# Patient Record
Sex: Male | Born: 1990 | Race: White | Hispanic: No | Marital: Single | Smoking: Never smoker
Health system: Southern US, Community
[De-identification: ages and names within clinical notes are randomized; demographics above are authoritative.]

---

## 2013-01-13 ENCOUNTER — Encounter (HOSPITAL_COMMUNITY): Payer: Self-pay | Admitting: Emergency Medicine

## 2013-01-13 ENCOUNTER — Emergency Department (HOSPITAL_COMMUNITY)
Admission: EM | Admit: 2013-01-13 | Discharge: 2013-01-13 | Disposition: A | Discharge status: Home / Self Care | Payer: BC Managed Care – PPO | Attending: Emergency Medicine | Admitting: Emergency Medicine

## 2013-01-13 ENCOUNTER — Emergency Department (HOSPITAL_COMMUNITY): Payer: BC Managed Care – PPO

## 2013-01-13 DIAGNOSIS — Y9239 Other specified sports and athletic area as the place of occurrence of the external cause: Secondary | ICD-10-CM | POA: Insufficient documentation

## 2013-01-13 DIAGNOSIS — Y9367 Activity, basketball: Secondary | ICD-10-CM | POA: Insufficient documentation

## 2013-01-13 DIAGNOSIS — S8992XA Unspecified injury of left lower leg, initial encounter: Secondary | ICD-10-CM

## 2013-01-13 DIAGNOSIS — W1801XA Striking against sports equipment with subsequent fall, initial encounter: Secondary | ICD-10-CM | POA: Insufficient documentation

## 2013-01-13 DIAGNOSIS — S8990XA Unspecified injury of unspecified lower leg, initial encounter: Secondary | ICD-10-CM | POA: Insufficient documentation

## 2013-01-13 MED ORDER — TRAMADOL HCL 50 MG PO TABS: 50.0000 mg | ORAL_TABLET | Freq: Four times a day (QID) | ORAL | Status: AC | PRN

## 2013-01-13 NOTE — ED Provider Notes (Signed)
Medical screening examination/treatment/procedure(s) were performed by non-physician practitioner and as supervising physician I was immediately available for consultation/collaboration.   Shelda Jakes, MD 01/13/13 [phone]

## 2013-01-13 NOTE — Progress Notes (Signed)
Orthopedic Tech Progress Note Patient Details:  Bruce Schmidt 08-10-1990 [id number]  Ortho Devices Type of Ortho Device: Crutches;Knee Immobilizer Ortho Device/Splint Interventions: Ordered;Application   Jennye Moccasin 01/13/2013, 6:37 PM

## 2013-01-13 NOTE — ED Provider Notes (Signed)
CSN: [id number]     Arrival date & time 01/13/13  1618 History   First MD Initiated Contact with Patient 01/13/13 1629     Chief Complaint  Patient presents with  . Knee Injury   (Consider location/radiation/quality/duration/timing/severity/associated sxs/prior Treatment) HPI Comments: Patient presents today with a chief complaint of left knee pain.  He reports that while playing basketball just prior to arrival he jumped up and when he came down he planted on his left leg.  His left knee then gave out and he fell to the ground.  He immediately began having pain of the left knee.  Pain has been constant since that time, but pain did improve with applying ice.  He reports that he has not attempted to bear weight since the injury.  He denies numbness or tingling.  He reports that initially he noticed mild swelling of the knee, but this has improved at this time.  He denies any prior injury to the knee.    The history is provided by the patient.    History reviewed. No pertinent past medical history. History reviewed. No pertinent past surgical history. No family history on file. History  Substance Use Topics  . Smoking status: Never Smoker   . Smokeless tobacco: Not on file  . Alcohol Use: No    Review of Systems  Musculoskeletal:       Left knee pain  All other systems reviewed and are negative.    Allergies  Review of patient's allergies indicates no known allergies.  Home Medications  No current outpatient prescriptions on file. BP 119/63  Pulse 91  Temp(Src) 98.1 F (36.7 C) (Oral)  Resp 18  Ht 5\' 9"  (1.753 m)  Wt 178 lb (80.74 kg)  BMI 26.27 kg/m2  SpO2 98% Physical Exam  Nursing note and vitals reviewed. Constitutional: He appears well-developed and well-nourished.  HENT:  Head: Normocephalic and atraumatic.  Cardiovascular: Normal rate, regular rhythm and normal heart sounds.   Pulses:      Dorsalis pedis pulses are 2+ on the right side, and 2+ on the left  side.  Pulmonary/Chest: Effort normal and breath sounds normal.  Musculoskeletal:       Left knee: He exhibits bony tenderness. He exhibits no swelling, no effusion, no deformity, no erythema, no LCL laxity and no MCL laxity. Tenderness found. Lateral joint line tenderness noted.  Pain with ROM of the left knee Negative anterior and posterior drawer.  Neurological: He is alert. No sensory deficit.  Skin: Skin is warm and dry.  Psychiatric: He has a normal mood and affect.    ED Course  Procedures (including critical care time) Labs Review Labs Reviewed - No data to display Imaging Review Dg Knee Complete 4 Views Left  01/13/2013   CLINICAL DATA:  Basketball injury. Left knee pain.  EXAM: LEFT KNEE - COMPLETE 4+ VIEW  COMPARISON:  None.  FINDINGS: There is no evidence of fracture, dislocation, or joint effusion. There is no evidence of arthropathy or other focal bone abnormality. Soft tissues are unremarkable.  IMPRESSION: Negative.   Electronically Signed   By: Drusilla Kanner M.D.   On: 01/13/2013 17:47    EKG Interpretation   None       MDM  No diagnosis found. Patient presenting with left knee pain after injuring his knee while playing basketball just prior to arrival.  Xray negative.  Patient neurovascularly intact.  Patient given knee immobilizer and crutches.  Patient given referral to Orthopedics.  Pascal Lux Alba, PA-C 01/13/13 [phone]

## 2013-01-13 NOTE — ED Notes (Signed)
Pt came down from jump while playing b-ball and felt excruciating pain to L knee when he landed on L leg.  Pain decreases with ice.  No deformity noted at this time.  Pain is lateral.  Denies numbness or tingling to foot.

## 2014-03-01 IMAGING — CR DG KNEE COMPLETE 4+V*L*
4 series · 4 of 4 positions shown · non-contrast
Comparison: None.

CLINICAL DATA: Basketball injury. Left knee pain.

EXAM:
LEFT KNEE - COMPLETE 4+ VIEW

[t knee ap left]
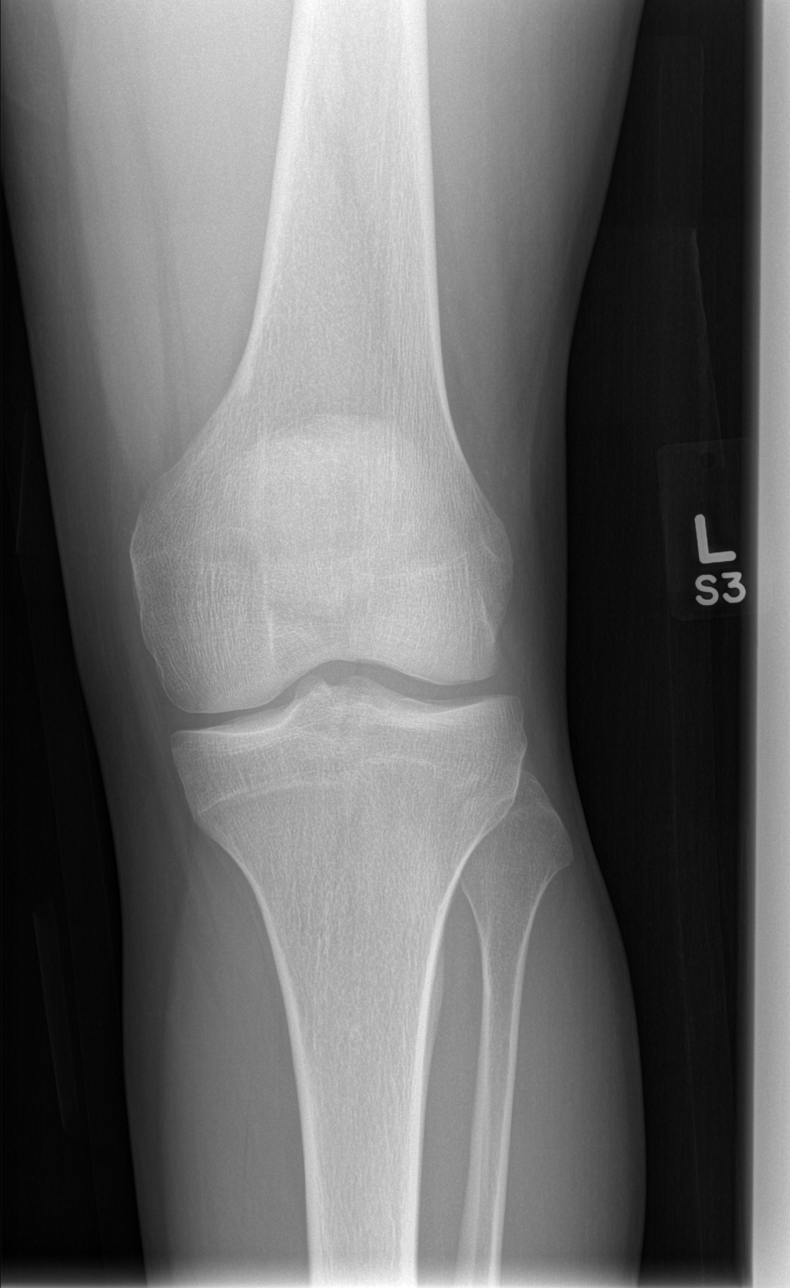

[t knee oblique left (1 of 2)]
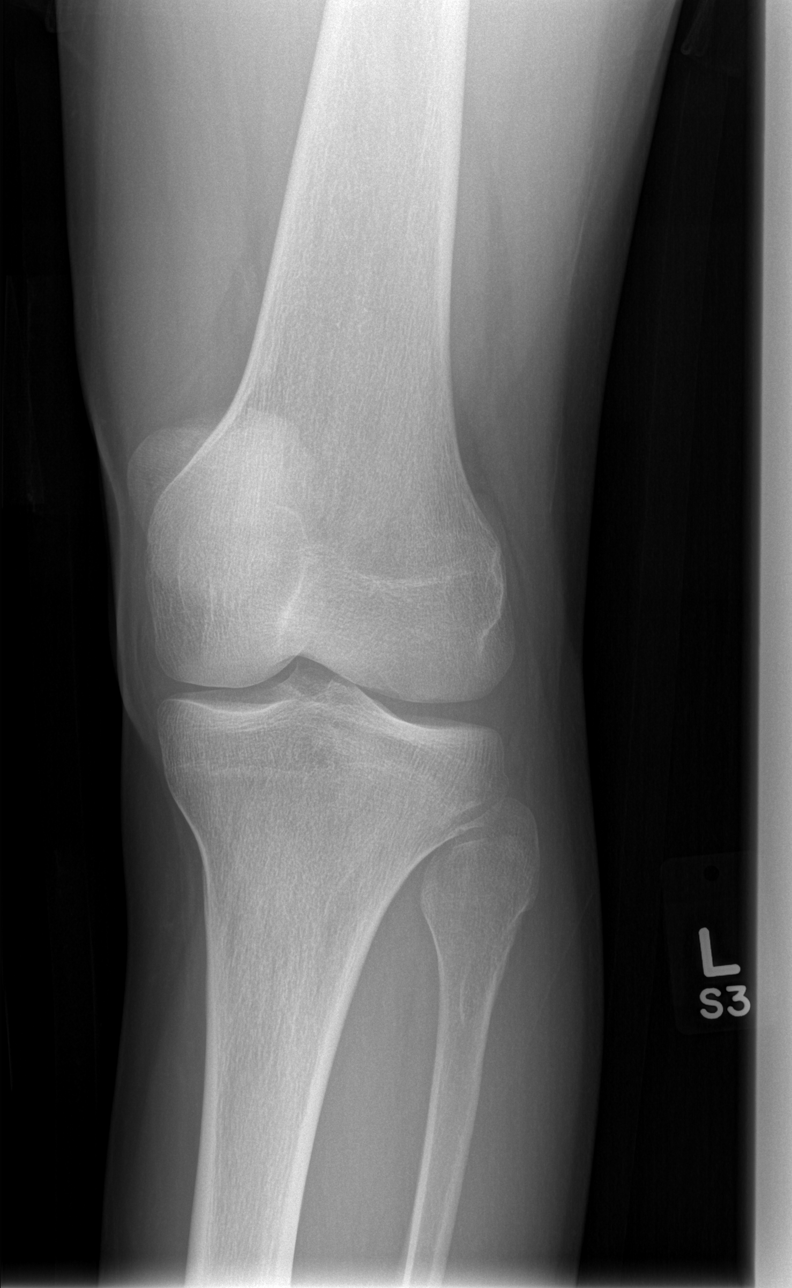

[t knee oblique left (2 of 2)]
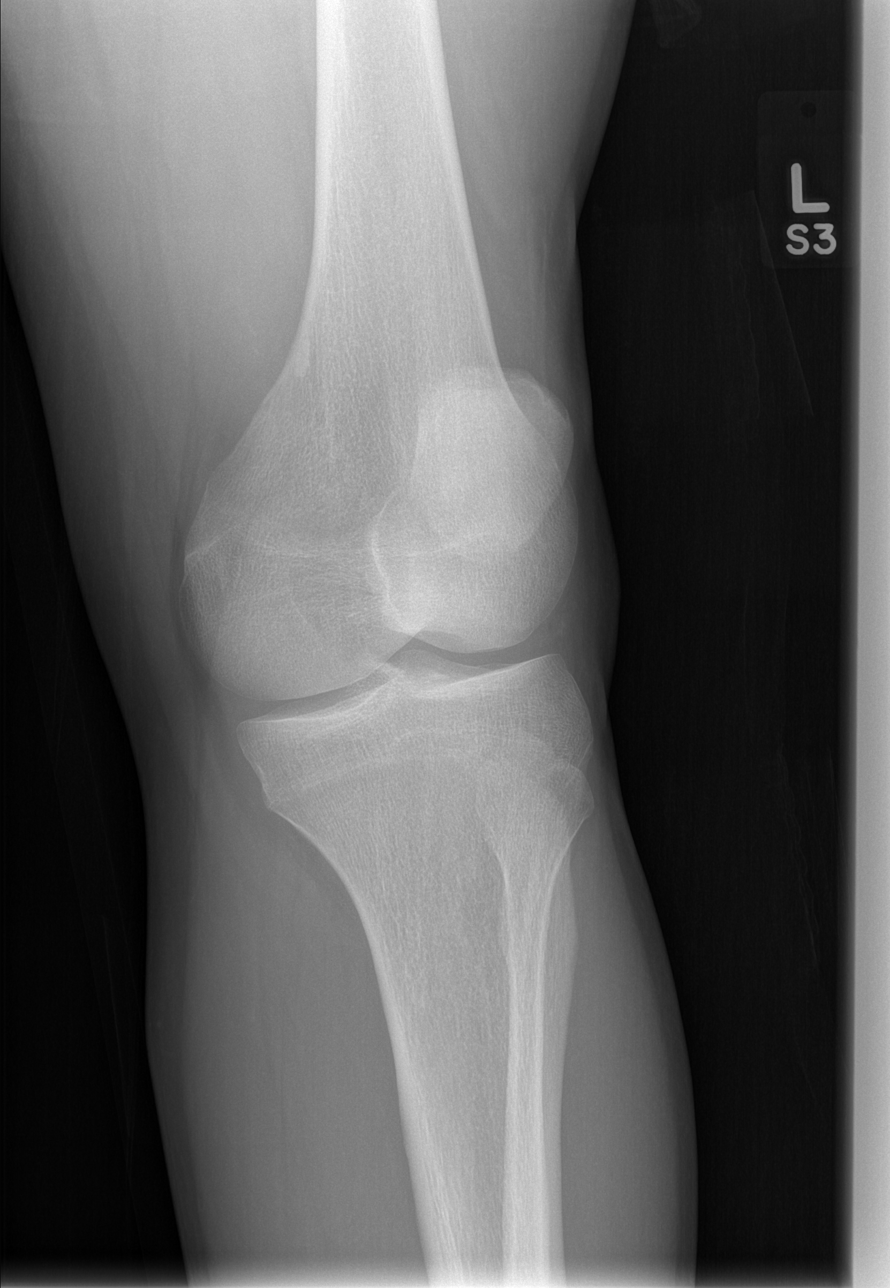

[t knee lat left]
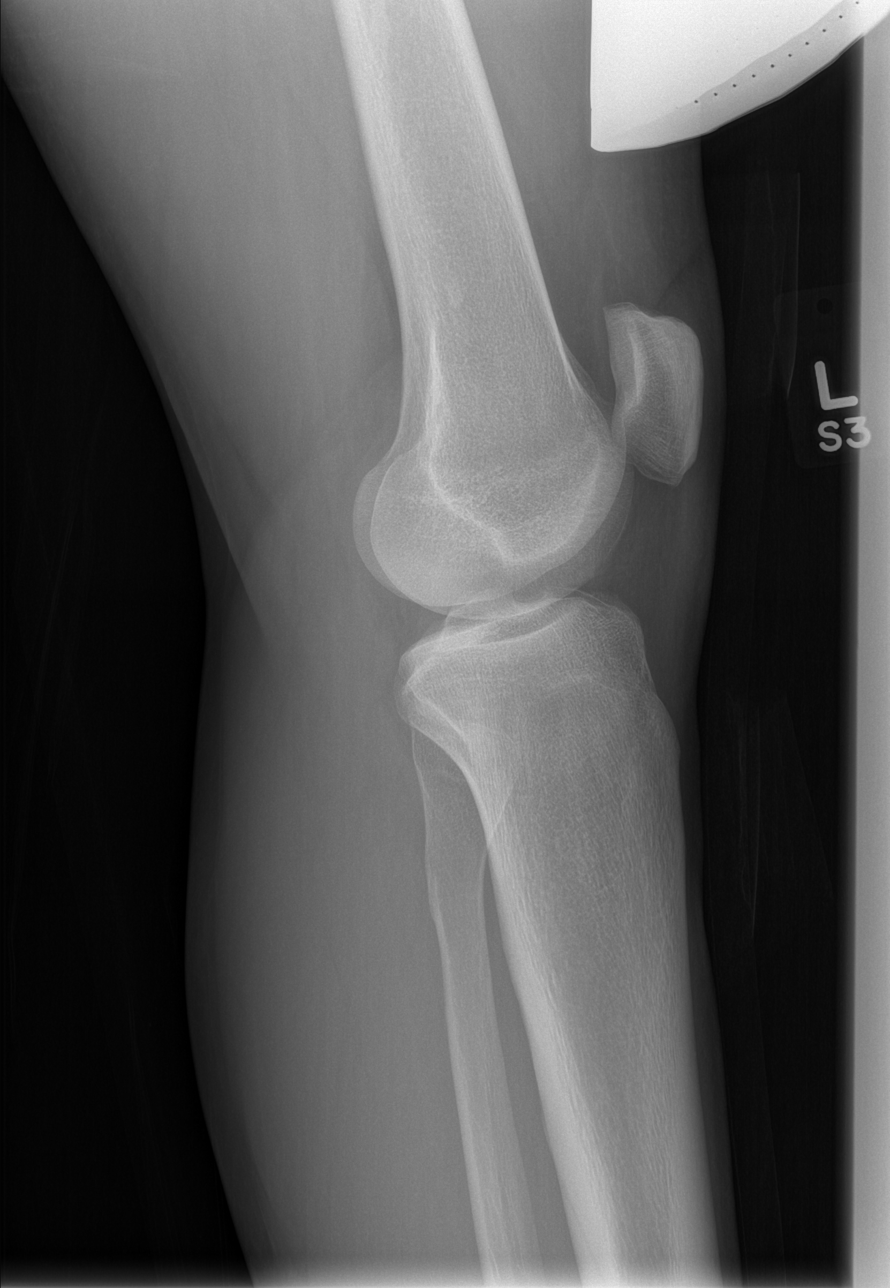

[4 of 4 positions shown; findings below may reference images not displayed]

FINDINGS: There is no evidence of fracture, dislocation, or joint effusion.
There is no evidence of arthropathy or other focal bone abnormality.
Soft tissues are unremarkable.
IMPRESSION: Negative.

## 2022-03-22 ENCOUNTER — Other Ambulatory Visit: Payer: Self-pay

## 2022-03-22 ENCOUNTER — Emergency Department (HOSPITAL_BASED_OUTPATIENT_CLINIC_OR_DEPARTMENT_OTHER): Payer: BC Managed Care – PPO

## 2022-03-22 ENCOUNTER — Encounter (HOSPITAL_BASED_OUTPATIENT_CLINIC_OR_DEPARTMENT_OTHER): Payer: Self-pay | Admitting: Emergency Medicine

## 2022-03-22 ENCOUNTER — Emergency Department (HOSPITAL_BASED_OUTPATIENT_CLINIC_OR_DEPARTMENT_OTHER)
Admission: EM | Admit: 2022-03-22 | Discharge: 2022-03-22 | Disposition: A | Discharge status: Home / Self Care | Payer: BC Managed Care – PPO | Attending: Emergency Medicine | Admitting: Emergency Medicine

## 2022-03-22 DIAGNOSIS — S39012A Strain of muscle, fascia and tendon of lower back, initial encounter: Secondary | ICD-10-CM | POA: Insufficient documentation

## 2022-03-22 DIAGNOSIS — W19XXXA Unspecified fall, initial encounter: Secondary | ICD-10-CM

## 2022-03-22 DIAGNOSIS — W108XXA Fall (on) (from) other stairs and steps, initial encounter: Secondary | ICD-10-CM | POA: Insufficient documentation

## 2022-03-22 DIAGNOSIS — S3992XA Unspecified injury of lower back, initial encounter: Secondary | ICD-10-CM | POA: Diagnosis present

## 2022-03-22 MED ORDER — METHOCARBAMOL 500 MG PO TABS: 500.0000 mg | ORAL_TABLET | Freq: Three times a day (TID) | ORAL | 0 refills | Status: AC | PRN

## 2022-03-22 MED ORDER — LIDOCAINE 5 % EX OINT: 1.0000 | TOPICAL_OINTMENT | Freq: Three times a day (TID) | CUTANEOUS | 0 refills | Status: AC | PRN

## 2022-03-22 MED ORDER — IBUPROFEN 800 MG PO TABS: 800.0000 mg | ORAL_TABLET | Freq: Three times a day (TID) | ORAL | 0 refills | Status: AC | PRN

## 2022-03-22 MED ORDER — KETOROLAC TROMETHAMINE 30 MG/ML IJ SOLN
30.0000 mg | Freq: Once | INTRAMUSCULAR | Status: AC
  Administered 2022-03-22: 30 mg via INTRAMUSCULAR
  Filled 2022-03-22: qty 1

## 2022-03-22 MED ORDER — DICLOFENAC SODIUM 1 % EX GEL: 2.0000 g | Freq: Four times a day (QID) | CUTANEOUS | 0 refills | Status: AC | PRN

## 2022-03-22 NOTE — ED Provider Notes (Signed)
Emergency Department Provider Note   I have reviewed the triage vital signs and the nursing notes.   HISTORY  Chief Complaint Fall   HPI Bruce Schmidt is a 31 y.o. male presents to the ED with pain after fall. Patient with fall several days prior. He landed on his backside and fell down 8-12 stairs without head injury. No LOC. No CP or abd pain. No numbness/weakness.    History reviewed. No pertinent past medical history.  Review of Systems  Constitutional: No fever/chills Eyes: No visual changes. ENT: No sore throat. Cardiovascular: Denies chest pain. Respiratory: Denies shortness of breath. Gastrointestinal: No abdominal pain.  No nausea, no vomiting.  No diarrhea.  No constipation. Genitourinary: Negative for dysuria. Musculoskeletal: Positive for back pain. Skin: Negative for rash. Neurological: Negative for headaches, focal weakness or numbness.  ____________________________________________   PHYSICAL EXAM:  VITAL SIGNS: ED Triage Vitals  Enc Vitals Group     BP 03/22/22 1332 133/80     Pulse Rate 03/22/22 1332 82     Resp 03/22/22 1332 18     Temp 03/22/22 1332 98 F (36.7 C)     Temp Source 03/22/22 1332 Oral     SpO2 03/22/22 1332 100 %     Weight 03/22/22 1334 210 lb (95.3 kg)     Height 03/22/22 1334 5\' 9"  (1.753 m)   Constitutional: Alert and oriented. Well appearing and in no acute distress. Eyes: Conjunctivae are normal.  Head: Atraumatic. Nose: No congestion/rhinnorhea. Mouth/Throat: Mucous membranes are moist.   Neck: No stridor.  No cervical spine tenderness to palpation. Cardiovascular: Normal rate, regular rhythm. Good peripheral circulation. Grossly normal heart sounds.   Respiratory: Normal respiratory effort.  No retractions. Lungs CTAB. Gastrointestinal: Soft and nontender. No distention.  Musculoskeletal: No lower extremity tenderness nor edema. No gross deformities of extremities. Tenderness diffusely throughout the lumbar  region and sacral area.  Neurologic:  Normal speech and language. No gross focal neurologic deficits are appreciated.  Skin:  Skin is warm, dry and intact. No rash noted.   ____________________________________________  RADIOLOGY  DG Pelvis 1-2 Views  Result Date: 03/22/2022 CLINICAL DATA:  Patient fell down 8-10 stairs 2 nights ago and complains of lower back and sacral pain EXAM: PELVIS - 1 VIEW COMPARISON:  None Available. FINDINGS: There is no evidence of pelvic fracture or diastasis. No pelvic bone lesions are seen. Nonobstructive bowel gas pattern. IMPRESSION: Negative. Electronically Signed   By: Beryle Flock M.D.   On: 03/22/2022 14:35   DG Lumbar Spine Complete  Result Date: 03/22/2022 CLINICAL DATA:  Low back pain after fall EXAM: LUMBAR SPINE - COMPLETE 4+ VIEW COMPARISON:  None Available. FINDINGS: There is no evidence of lumbar spine fracture. Alignment is normal. Intervertebral disc spaces are maintained. IMPRESSION: Negative. Electronically Signed   By: Davina Poke D.O.   On: 03/22/2022 13:48    ____________________________________________   PROCEDURES  Procedure(s) performed:   Procedures  None ____________________________________________   INITIAL IMPRESSION / ASSESSMENT AND PLAN / ED COURSE  Pertinent labs & imaging results that were available during my care of the patient were reviewed by me and considered in my medical decision making (see chart for details).   This patient is Presenting for Evaluation of back pain, which does require a range of treatment options, and is a complaint that involves a high risk of morbidity and mortality.  The Differential Diagnoses includes but is not exclusive to musculoskeletal back pain, renal colic, urinary tract infection, pyelonephritis,  intra-abdominal causes of back pain, aortic aneurysm or dissection, cauda equina syndrome, sciatica, lumbar disc disease, thoracic disc disease, etc.   Critical Interventions-     Medications  ketorolac (TORADOL) 30 MG/ML injection 30 mg (30 mg Intramuscular Given 03/22/22 1438)    Reassessment after intervention: Pain improved.    Radiologic Tests Ordered, included pelvis and lumbar spine XR. I independently interpreted the images and agree with radiology interpretation.    Social Determinants of Health Risk no IVDA.   Medical Decision Making: Summary:  Patient presents to the ED with back pain after fall. No neuro deficits to prompt emergent MRI.   Reevaluation with update and discussion with patient. Discussed x rays and plan for supportive care/snti-inflammation meds and PCP follow up.   Patient's presentation is most consistent with acute illness / injury with system symptoms.   Disposition: discharge  ____________________________________________  FINAL CLINICAL IMPRESSION(S) / ED DIAGNOSES  Final diagnoses:  Fall, initial encounter  Strain of lumbar region, initial encounter     NEW OUTPATIENT MEDICATIONS STARTED DURING THIS VISIT:  Discharge Medication List as of 03/22/2022  2:45 PM     START taking these medications   Details  diclofenac Sodium (VOLTAREN) 1 % GEL Apply 2 g topically 4 (four) times daily as needed., Starting Sun 03/22/2022, Print    ibuprofen (ADVIL) 800 MG tablet Take 1 tablet (800 mg total) by mouth every 8 (eight) hours as needed for moderate pain., Starting Sun 03/22/2022, Print    lidocaine (XYLOCAINE) 5 % ointment Apply 1 Application topically 3 (three) times daily as needed for moderate pain., Starting Sun 03/22/2022, Print    methocarbamol (ROBAXIN) 500 MG tablet Take 1 tablet (500 mg total) by mouth every 8 (eight) hours as needed for muscle spasms., Starting Sun 03/22/2022, Print        Note:  This document was prepared using Dragon voice recognition software and may include unintentional dictation errors.  Alona Bene, MD, [facility]    Quiana Cobaugh, Arlyss Repress, MD 03/29/22 [phone]

## 2022-03-22 NOTE — ED Notes (Signed)
ED Provider at bedside. 

## 2022-03-22 NOTE — ED Triage Notes (Signed)
Pt fell down 8-10 stairs 2 nights ago; c/o lower back and sacral pain

## 2022-03-22 NOTE — Discharge Instructions (Signed)

## 2022-03-22 NOTE — ED Notes (Signed)
Fell down stairs at home after party. 8-12 stairs. Landed butt and back, rode down to bottom. No LOC, no trauma to head. Full distal PMS intact. Sore with rapid movement.

## 2022-06-20 ENCOUNTER — Other Ambulatory Visit: Payer: Self-pay

## 2022-06-20 ENCOUNTER — Encounter (HOSPITAL_BASED_OUTPATIENT_CLINIC_OR_DEPARTMENT_OTHER): Payer: Self-pay | Admitting: Urology

## 2022-06-20 ENCOUNTER — Emergency Department (HOSPITAL_BASED_OUTPATIENT_CLINIC_OR_DEPARTMENT_OTHER)
Admission: EM | Admit: 2022-06-20 | Discharge: 2022-06-20 | Disposition: A | Discharge status: Home / Self Care | Payer: BC Managed Care – PPO | Attending: Emergency Medicine | Admitting: Emergency Medicine

## 2022-06-20 DIAGNOSIS — M545 Low back pain, unspecified: Secondary | ICD-10-CM | POA: Diagnosis present

## 2022-06-20 DIAGNOSIS — L0591 Pilonidal cyst without abscess: Secondary | ICD-10-CM

## 2022-06-20 MED ORDER — NAPROXEN 500 MG PO TABS: 500.0000 mg | ORAL_TABLET | Freq: Two times a day (BID) | ORAL | 0 refills | Status: AC

## 2022-06-20 MED ORDER — AMOXICILLIN-POT CLAVULANATE 875-125 MG PO TABS: 1.0000 | ORAL_TABLET | Freq: Two times a day (BID) | ORAL | 0 refills | Status: AC

## 2022-06-20 MED ORDER — DOXYCYCLINE HYCLATE 100 MG PO TABS: 100.0000 mg | ORAL_TABLET | Freq: Two times a day (BID) | ORAL | 0 refills | Status: DC

## 2022-06-20 NOTE — ED Provider Notes (Signed)
[facility] Provider Note   CSN: [id number] Arrival date & time: 06/20/22  [id number]     History  Chief Complaint  Patient presents with   Cyst    Bruce Schmidt is a 32 y.o. male.  HPI   Patient presents to the ED for evaluation of pain in his lower back area.  Patient states he noted this after lifting weights a few days ago.  He feels a sore spot at the top of his buttocks.  It is tender to palpation.  It gets worse when he sitting.  He has not noticed any drainage.  No fevers or chills patient has never had this issue before  Home Medications Prior to Admission medications   Medication Sig Start Date End Date Taking? Authorizing Provider  amoxicillin-clavulanate (AUGMENTIN) 875-125 MG tablet Take 1 tablet by mouth every 12 (twelve) hours. 06/20/22  Yes Dorie Rank, MD  naproxen (NAPROSYN) 500 MG tablet Take 1 tablet (500 mg total) by mouth 2 (two) times daily with a meal for 7 days. As needed for pain 06/20/22 06/27/22 Yes Dorie Rank, MD  diclofenac Sodium (VOLTAREN) 1 % GEL Apply 2 g topically 4 (four) times daily as needed. 03/22/22   Long, Wonda Olds, MD  ibuprofen (ADVIL) 800 MG tablet Take 1 tablet (800 mg total) by mouth every 8 (eight) hours as needed for moderate pain. 03/22/22   Long, Wonda Olds, MD  lidocaine (XYLOCAINE) 5 % ointment Apply 1 Application topically 3 (three) times daily as needed for moderate pain. 03/22/22   Long, Wonda Olds, MD  methocarbamol (ROBAXIN) 500 MG tablet Take 1 tablet (500 mg total) by mouth every 8 (eight) hours as needed for muscle spasms. 03/22/22   Long, Wonda Olds, MD  traMADol (ULTRAM) 50 MG tablet Take 1 tablet (50 mg total) by mouth every 6 (six) hours as needed for pain. 01/13/13   Hyman Bible, PA-C      Allergies    Patient has no known allergies.    Review of Systems   Review of Systems  Physical Exam Updated Vital Signs BP 129/89 (BP Location: Right Arm)   Pulse (!) 103   Temp 98.9  F (37.2 C) (Oral)   Resp 18   Ht 1.753 m (5\' 9" )   Wt 95.3 kg   SpO2 100%   BMI 31.01 kg/m  Physical Exam Vitals and nursing note reviewed.  Constitutional:      General: He is not in acute distress.    Appearance: He is well-developed.  HENT:     Head: Normocephalic and atraumatic.     Right Ear: External ear normal.     Left Ear: External ear normal.  Eyes:     General: No scleral icterus.       Right eye: No discharge.        Left eye: No discharge.     Conjunctiva/sclera: Conjunctivae normal.  Neck:     Trachea: No tracheal deviation.  Cardiovascular:     Rate and Rhythm: Normal rate.  Pulmonary:     Effort: Pulmonary effort is normal. No respiratory distress.     Breath sounds: No stridor.  Abdominal:     General: There is no distension.  Musculoskeletal:        General: Tenderness present. No swelling or deformity.     Cervical back: Neck supple.     Comments: Small 1 to 2 cm of erythema at the natal cleft, mild erythema, evidence  of pilonidal cyst, no induration no fluctuance, no drainage  Skin:    General: Skin is warm and dry.     Findings: No rash.  Neurological:     Mental Status: He is alert. Mental status is at baseline.     Cranial Nerves: No dysarthria or facial asymmetry.     Motor: No seizure activity.     ED Results / Procedures / Treatments   Labs (all labs ordered are listed, but only abnormal results are displayed) Labs Reviewed - No data to display  EKG None  Radiology No results found.  Procedures Procedures    Medications Ordered in ED Medications - No data to display  ED Course/ Medical Decision Making/ A&P                             Medical Decision Making Risk Prescription drug management.   Patient presented to the ED for evaluation of an area of swelling and tenderness at the top of his buttock.  Patient has mild erythema and tenderness noted at the natal cleft.  No fluctuance or induration palpated.  Bedside  ultrasound performed and no obvious abscess or fluid collection.  There does appear to be inflammation of the tissue.  Right now exam is consistent with infected pilonidal cyst but no abscess that requires drainage.  Will do warm compresses antibiotics and discussed returning to be rechecked in a few days if he notices increasing swelling or no improvement.        Final Clinical Impression(s) / ED Diagnoses Final diagnoses:  Infected pilonidal cyst    Rx / DC Orders ED Discharge Orders          Ordered    naproxen (NAPROSYN) 500 MG tablet  2 times daily with meals        06/20/22 0937    doxycycline (VIBRA-TABS) 100 MG tablet  2 times daily,   Status:  Discontinued        06/20/22 0937    amoxicillin-clavulanate (AUGMENTIN) 875-125 MG tablet  Every 12 hours        06/20/22 0941              Dorie Rank, MD 06/20/22 [phone]

## 2022-06-20 NOTE — ED Notes (Signed)
Pt discharged to home. Discharge instructions have been discussed with patient and/or family members. Pt verbally acknowledges understanding d/c instructions, and endorses comprehension to checkout at registration before leaving.  °

## 2022-06-20 NOTE — Discharge Instructions (Signed)
Take the antibiotics and medications for pain and to help with your inflamed pilonidal cyst.  Try warm soaks and compresses to help with the inflammation and pain.  Follow-up to be rechecked in a few days if you feel like it is not improving or you notice increasing swelling.

## 2022-06-20 NOTE — ED Triage Notes (Signed)
Pt states lower back pain that started after llifting weights  Noticed cyst like area above buttocks that started 2 days ago  Pt states worse with sitting  Area is soft, denies drainage
